# Patient Record
Sex: Female | Born: 1995 | Race: Black or African American | Hispanic: No | Marital: Single | Smoking: Never smoker
Health system: Southern US, Community
[De-identification: ages and names within clinical notes are randomized; demographics above are authoritative.]

---

## 2017-05-23 ENCOUNTER — Inpatient Hospital Stay (HOSPITAL_COMMUNITY)
Admission: EM | Admit: 2017-05-23 | Discharge: 2017-06-02 | DRG: 460 | Disposition: A | Discharge status: Rehabilitation Facility | Payer: No Typology Code available for payment source | Attending: General Surgery | Admitting: General Surgery

## 2017-05-23 ENCOUNTER — Emergency Department (HOSPITAL_COMMUNITY): Payer: No Typology Code available for payment source

## 2017-05-23 ENCOUNTER — Encounter (HOSPITAL_COMMUNITY): Payer: Self-pay | Admitting: Emergency Medicine

## 2017-05-23 DIAGNOSIS — M5416 Radiculopathy, lumbar region: Secondary | ICD-10-CM | POA: Diagnosis present

## 2017-05-23 DIAGNOSIS — R112 Nausea with vomiting, unspecified: Secondary | ICD-10-CM

## 2017-05-23 DIAGNOSIS — S2242XA Multiple fractures of ribs, left side, initial encounter for closed fracture: Secondary | ICD-10-CM | POA: Diagnosis present

## 2017-05-23 DIAGNOSIS — S32032A Unstable burst fracture of third lumbar vertebra, initial encounter for closed fracture: Secondary | ICD-10-CM

## 2017-05-23 DIAGNOSIS — K5900 Constipation, unspecified: Secondary | ICD-10-CM | POA: Diagnosis not present

## 2017-05-23 DIAGNOSIS — Y92410 Unspecified street and highway as the place of occurrence of the external cause: Secondary | ICD-10-CM

## 2017-05-23 DIAGNOSIS — D62 Acute posthemorrhagic anemia: Secondary | ICD-10-CM | POA: Diagnosis not present

## 2017-05-23 DIAGNOSIS — M48061 Spinal stenosis, lumbar region without neurogenic claudication: Secondary | ICD-10-CM | POA: Diagnosis present

## 2017-05-23 DIAGNOSIS — Z419 Encounter for procedure for purposes other than remedying health state, unspecified: Secondary | ICD-10-CM

## 2017-05-23 LAB — CBC WITH DIFFERENTIAL/PLATELET
BASOS ABS: 0 10*3/uL (ref 0.0–0.1)
Basophils Relative: 0 %
EOS PCT: 0 %
Eosinophils Absolute: 0 10*3/uL (ref 0.0–0.7)
HEMATOCRIT: 38.8 % (ref 36.0–46.0)
Hemoglobin: 13.7 g/dL (ref 12.0–15.0)
LYMPHS PCT: 4 %
Lymphs Abs: 0.8 10*3/uL (ref 0.7–4.0)
MCH: 30 pg (ref 26.0–34.0)
MCHC: 35.3 g/dL (ref 30.0–36.0)
MCV: 84.9 fL (ref 78.0–100.0)
Monocytes Absolute: 0.7 10*3/uL (ref 0.1–1.0)
Monocytes Relative: 4 %
NEUTROS ABS: 18.5 10*3/uL — AB (ref 1.7–7.7)
Neutrophils Relative %: 92 %
PLATELETS: 207 10*3/uL (ref 150–400)
RBC: 4.57 MIL/uL (ref 3.87–5.11)
RDW: 12.3 % (ref 11.5–15.5)
WBC: 20 10*3/uL — AB (ref 4.0–10.5)

## 2017-05-23 LAB — BASIC METABOLIC PANEL
ANION GAP: 10 (ref 5–15)
BUN: 11 mg/dL (ref 6–20)
CO2: 22 mmol/L (ref 22–32)
Calcium: 9.3 mg/dL (ref 8.9–10.3)
Chloride: 102 mmol/L (ref 101–111)
Creatinine, Ser: 0.93 mg/dL (ref 0.44–1.00)
GFR calc Af Amer: >60 mL/min (ref >60)
GLUCOSE: 132 mg/dL — AB (ref 65–99)
POTASSIUM: 3.9 mmol/L (ref 3.5–5.1)
Sodium: 134 mmol/L — ABNORMAL LOW (ref 135–145)

## 2017-05-23 LAB — I-STAT BETA HCG BLOOD, ED (MC, WL, AP ONLY): I-stat hCG, quantitative: <5 m[IU]/mL (ref <5)

## 2017-05-23 LAB — MRSA PCR SCREENING: MRSA by PCR: NEGATIVE

## 2017-05-23 MED ORDER — PANTOPRAZOLE SODIUM 40 MG PO TBEC
40.0000 mg | DELAYED_RELEASE_TABLET | Freq: Every day | ORAL | Status: DC
  Administered 2017-05-24 – 2017-06-02 (×9): 40 mg via ORAL
  Filled 2017-05-23 (×10): qty 1

## 2017-05-23 MED ORDER — PANTOPRAZOLE SODIUM 40 MG IV SOLR: 40.0000 mg | Freq: Every day | INTRAVENOUS | Status: DC

## 2017-05-23 MED ORDER — ONDANSETRON HCL 4 MG/2ML IJ SOLN
4.0000 mg | Freq: Four times a day (QID) | INTRAMUSCULAR | Status: DC | PRN
  Administered 2017-05-23 – 2017-05-24 (×3): 4 mg via INTRAVENOUS
  Filled 2017-05-23 (×3): qty 2

## 2017-05-23 MED ORDER — ONDANSETRON 4 MG PO TBDP: 4.0000 mg | ORAL_TABLET | Freq: Four times a day (QID) | ORAL | Status: DC | PRN

## 2017-05-23 MED ORDER — ONDANSETRON HCL 4 MG/2ML IJ SOLN
4.0000 mg | Freq: Once | INTRAMUSCULAR | Status: AC
  Administered 2017-05-23: 4 mg via INTRAVENOUS
  Filled 2017-05-23: qty 2

## 2017-05-23 MED ORDER — IOPAMIDOL (ISOVUE-300) INJECTION 61%
100.0000 mL | Freq: Once | INTRAVENOUS | Status: AC | PRN
  Administered 2017-05-23: 100 mL via INTRAVENOUS

## 2017-05-23 MED ORDER — HYDROMORPHONE HCL 2 MG/ML IJ SOLN
1.0000 mg | Freq: Once | INTRAMUSCULAR | Status: DC
  Filled 2017-05-23: qty 1

## 2017-05-23 MED ORDER — IOPAMIDOL (ISOVUE-300) INJECTION 61%
INTRAVENOUS | Status: AC
  Filled 2017-05-23: qty 100

## 2017-05-23 MED ORDER — HYDROMORPHONE HCL 2 MG/ML IJ SOLN
1.0000 mg | Freq: Once | INTRAMUSCULAR | Status: AC
  Administered 2017-05-23: 1 mg via INTRAVENOUS
  Filled 2017-05-23: qty 1

## 2017-05-23 MED ORDER — MORPHINE SULFATE (PF) 4 MG/ML IV SOLN
1.0000 mg | INTRAVENOUS | Status: DC | PRN
  Administered 2017-05-23 (×3): 1 mg via INTRAVENOUS
  Filled 2017-05-23 (×3): qty 1

## 2017-05-23 MED ORDER — HYDROMORPHONE HCL 1 MG/ML IJ SOLN
1.0000 mg | INTRAMUSCULAR | Status: DC | PRN
  Administered 2017-05-23 – 2017-05-24 (×3): 1 mg via INTRAVENOUS
  Filled 2017-05-23 (×3): qty 1

## 2017-05-23 MED ORDER — PROMETHAZINE HCL 25 MG/ML IJ SOLN
12.5000 mg | Freq: Four times a day (QID) | INTRAMUSCULAR | Status: DC | PRN
  Administered 2017-05-23 – 2017-06-01 (×13): 12.5 mg via INTRAVENOUS
  Filled 2017-05-23 (×14): qty 1

## 2017-05-23 MED ORDER — OXYCODONE-ACETAMINOPHEN 5-325 MG PO TABS
1.0000 | ORAL_TABLET | Freq: Once | ORAL | Status: AC
  Administered 2017-05-23: 1 via ORAL
  Filled 2017-05-23: qty 1

## 2017-05-23 MED ORDER — MORPHINE SULFATE (PF) 4 MG/ML IV SOLN
4.0000 mg | Freq: Once | INTRAVENOUS | Status: AC
  Administered 2017-05-23: 4 mg via INTRAVENOUS
  Filled 2017-05-23: qty 1

## 2017-05-23 MED ORDER — HYDROMORPHONE HCL 1 MG/ML IJ SOLN: 1.0000 mg | INTRAMUSCULAR | Status: DC | PRN

## 2017-05-23 MED ORDER — HYDROMORPHONE HCL 2 MG/ML IJ SOLN: 1.0000 mg | INTRAMUSCULAR | Status: DC | PRN

## 2017-05-23 MED ORDER — DEXTROSE-NACL 5-0.9 % IV SOLN
INTRAVENOUS | Status: DC
  Administered 2017-05-23 – 2017-05-25 (×5): via INTRAVENOUS

## 2017-05-23 NOTE — Progress Notes (Signed)
Paged Dr  Luisa Hartornett regarding pt nausea. See prev note
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
## 2017-05-23 NOTE — Progress Notes (Signed)
Patient is continuing to vomit yellow emesis after Zofran 4 mg IV given, see eMAR. Dr Derryl Harborostella is paged and awaiting call back.
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
## 2017-05-23 NOTE — ED Notes (Signed)
Patient transported to CT 

## 2017-05-23 NOTE — ED Notes (Signed)
Ortho tech notified of need for TLSO brace  

## 2017-05-23 NOTE — ED Notes (Signed)
Dr. Luisa Hartornett paged to RN Nehemiah SettleBrooke per her request

## 2017-05-24 ENCOUNTER — Other Ambulatory Visit: Payer: Self-pay

## 2017-05-24 ENCOUNTER — Encounter (HOSPITAL_COMMUNITY): Payer: Self-pay

## 2017-05-24 LAB — COMPREHENSIVE METABOLIC PANEL
ALBUMIN: 3.3 g/dL — AB (ref 3.5–5.0)
ALT: 75 U/L — AB (ref 14–54)
AST: 82 U/L — AB (ref 15–41)
Alkaline Phosphatase: 41 U/L (ref 38–126)
Anion gap: 8 (ref 5–15)
BILIRUBIN TOTAL: 0.7 mg/dL (ref 0.3–1.2)
BUN: 5 mg/dL — AB (ref 6–20)
CHLORIDE: 106 mmol/L (ref 101–111)
CO2: 23 mmol/L (ref 22–32)
CREATININE: 0.74 mg/dL (ref 0.44–1.00)
Calcium: 8.7 mg/dL — ABNORMAL LOW (ref 8.9–10.3)
GFR calc Af Amer: >60 mL/min (ref >60)
GLUCOSE: 143 mg/dL — AB (ref 65–99)
Potassium: 4.1 mmol/L (ref 3.5–5.1)
Sodium: 137 mmol/L (ref 135–145)
TOTAL PROTEIN: 6.3 g/dL — AB (ref 6.5–8.1)

## 2017-05-24 LAB — CBC
HEMATOCRIT: 38 % (ref 36.0–46.0)
Hemoglobin: 12.9 g/dL (ref 12.0–15.0)
MCH: 29.5 pg (ref 26.0–34.0)
MCHC: 33.9 g/dL (ref 30.0–36.0)
MCV: 86.8 fL (ref 78.0–100.0)
Platelets: 179 10*3/uL (ref 150–400)
RBC: 4.38 MIL/uL (ref 3.87–5.11)
RDW: 12.8 % (ref 11.5–15.5)
WBC: 13.6 10*3/uL — ABNORMAL HIGH (ref 4.0–10.5)

## 2017-05-24 LAB — HIV ANTIBODY (ROUTINE TESTING W REFLEX): HIV Screen 4th Generation wRfx: NONREACTIVE

## 2017-05-24 MED ORDER — LEVONORGESTREL-ETHINYL ESTRAD 0.1-20 MG-MCG PO TABS
1.0000 | ORAL_TABLET | Freq: Every day | ORAL | Status: DC
  Administered 2017-05-24 – 2017-06-01 (×5): 1 via ORAL

## 2017-05-24 MED ORDER — ENOXAPARIN SODIUM 40 MG/0.4ML ~~LOC~~ SOLN
40.0000 mg | SUBCUTANEOUS | Status: DC
  Administered 2017-05-24 – 2017-06-01 (×7): 40 mg via SUBCUTANEOUS
  Filled 2017-05-24 (×8): qty 0.4

## 2017-05-24 MED ORDER — DOCUSATE SODIUM 100 MG PO CAPS
100.0000 mg | ORAL_CAPSULE | Freq: Two times a day (BID) | ORAL | Status: DC
  Administered 2017-05-24 – 2017-05-25 (×4): 100 mg via ORAL
  Filled 2017-05-24 (×5): qty 1

## 2017-05-24 MED ORDER — ACETAMINOPHEN 500 MG PO TABS
1000.0000 mg | ORAL_TABLET | Freq: Four times a day (QID) | ORAL | Status: DC
  Administered 2017-05-24 – 2017-05-26 (×7): 1000 mg via ORAL
  Filled 2017-05-24 (×9): qty 2

## 2017-05-24 MED ORDER — OXYCODONE HCL 5 MG PO TABS
5.0000 mg | ORAL_TABLET | ORAL | Status: DC | PRN
  Administered 2017-05-24 – 2017-05-26 (×5): 10 mg via ORAL
  Filled 2017-05-24 (×5): qty 2

## 2017-05-24 MED ORDER — NON FORMULARY: 1.0000 | Freq: Every day | Status: DC

## 2017-05-24 MED ORDER — TRAMADOL HCL 50 MG PO TABS: 50.0000 mg | ORAL_TABLET | Freq: Four times a day (QID) | ORAL | Status: DC | PRN

## 2017-05-24 MED ORDER — MORPHINE SULFATE (PF) 4 MG/ML IV SOLN
1.0000 mg | INTRAVENOUS | Status: DC | PRN
  Administered 2017-05-24 – 2017-06-01 (×11): 2 mg via INTRAVENOUS
  Filled 2017-05-24 (×11): qty 1

## 2017-05-24 MED ORDER — METHOCARBAMOL 500 MG PO TABS
750.0000 mg | ORAL_TABLET | Freq: Three times a day (TID) | ORAL | Status: DC
  Administered 2017-05-24 – 2017-06-02 (×21): 750 mg via ORAL
  Filled 2017-05-24 (×26): qty 2

## 2017-05-24 NOTE — Progress Notes (Signed)
Orthopedic Tech Progress Note Patient Details:  Melissa Kramer 05-16-1995 [id number]  Patient ID: Melissa Kramer, female   DOB: 05-16-1995, 22 y.o.   MRN: [id number]   Nikki DomCrawford, Alithea Lapage 05/24/2017, 10:18 AM Called in bio-tech brace order; spoke with answering service

## 2017-05-24 NOTE — Progress Notes (Signed)
Clam shell brace applied while in bed.

## 2017-05-24 NOTE — Progress Notes (Signed)
1900: Handoff report received from RN. Pt sleeping with family at bedside. Discussed plan of care for the shift with pt's grandmother who is amenable to plan.  2000: Pt woken by nausea. Antiemetic and pain medication administered.   0000: Pt again awoken by pain an nausea.  0400: Pt continues resting comfortably.  0700: Handoff report given to RN. No acute events overnight.
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
## 2017-05-25 ENCOUNTER — Other Ambulatory Visit: Payer: Self-pay | Admitting: Neurosurgery

## 2017-05-25 ENCOUNTER — Inpatient Hospital Stay (HOSPITAL_COMMUNITY): Payer: No Typology Code available for payment source

## 2017-05-25 LAB — CBC
HCT: 36.1 % (ref 36.0–46.0)
Hemoglobin: 12.2 g/dL (ref 12.0–15.0)
MCH: 29.6 pg (ref 26.0–34.0)
MCHC: 33.8 g/dL (ref 30.0–36.0)
MCV: 87.6 fL (ref 78.0–100.0)
PLATELETS: 164 10*3/uL (ref 150–400)
RBC: 4.12 MIL/uL (ref 3.87–5.11)
RDW: 12.9 % (ref 11.5–15.5)
WBC: 12.1 10*3/uL — ABNORMAL HIGH (ref 4.0–10.5)

## 2017-05-25 NOTE — Progress Notes (Signed)
Neurosurgery Progress Note  Nausea persists Back pain manageable. Denies weakness in legs Voiding normal  EXAM:  BP 126/84   Pulse 91   Temp 99.4 F (37.4 C) (Oral)   Resp 14   Ht 5\' 9"  (1.753 m)   Wt 58.1 kg (128 lb)   LMP 05/16/2017 (Exact Date)   SpO2 96%   BMI 18.90 kg/m   Awake, alert, oriented  Speech fluent, appropriate  In clam shell brace MAEW with good strength. No LE deficits  PLAN Stable this am. Scheduled for surgery tomorrow. NPO at midnight Continue current care

## 2017-05-25 NOTE — Progress Notes (Signed)
   05/25/17 1200  Clinical Encounter Type  Visited With Patient and family together  Visit Type Social support;Spiritual support;Follow-up  Referral From Nurse  Consult/Referral To Chaplain  Spiritual Encounters  Spiritual Needs Prayer;Emotional  Stress Factors  Patient Stress Factors Health changes  Family Stress Factors Major life changes  Chaplain responded to nurse consult for PT prayer.  Present in the room were grandmother of PT and PT.  The PT had concerns about surgery tomorrow and anxiousness.  Chaplain prayed with PT and grandmother addressing both of their concerns.  Chaplain will revisit family tomorrow to pray before surgery

## 2017-05-25 NOTE — Progress Notes (Signed)
1900: Handoff report received from RN. Pt resting in bed. Discussed plan of care for the shift; pt amenable to plan. Pt denies nausea today.  0000: Pt resting comfortably.  0400: Pt continues resting comfortably.  0700: Handoff report given to RN. No acute events overnight.
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
## 2017-05-26 ENCOUNTER — Inpatient Hospital Stay (HOSPITAL_COMMUNITY): Payer: No Typology Code available for payment source

## 2017-05-26 ENCOUNTER — Encounter (HOSPITAL_COMMUNITY): Payer: Self-pay | Admitting: Certified Registered"

## 2017-05-26 ENCOUNTER — Inpatient Hospital Stay (HOSPITAL_COMMUNITY): Payer: No Typology Code available for payment source | Admitting: Certified Registered"

## 2017-05-26 ENCOUNTER — Inpatient Hospital Stay (HOSPITAL_COMMUNITY): Admission: EM | Disposition: A | Discharge status: Rehabilitation Facility | Payer: Self-pay | Source: Home / Self Care

## 2017-05-26 HISTORY — PX: APPLICATION OF ROBOTIC ASSISTANCE FOR SPINAL PROCEDURE: SHX6753

## 2017-05-26 HISTORY — PX: ANTERIOR LAT LUMBAR FUSION: SHX1168

## 2017-05-26 HISTORY — PX: POSTERIOR LUMBAR FUSION 4 LEVEL: SHX6037

## 2017-05-26 LAB — BASIC METABOLIC PANEL
ANION GAP: 8 (ref 5–15)
CALCIUM: 8.4 mg/dL — AB (ref 8.9–10.3)
CO2: 23 mmol/L (ref 22–32)
Chloride: 109 mmol/L (ref 101–111)
Creatinine, Ser: 0.72 mg/dL (ref 0.44–1.00)
GFR calc Af Amer: >60 mL/min (ref >60)
GLUCOSE: 103 mg/dL — AB (ref 65–99)
Potassium: 3.7 mmol/L (ref 3.5–5.1)
Sodium: 140 mmol/L (ref 135–145)

## 2017-05-26 LAB — CBC
HCT: 34.3 % — ABNORMAL LOW (ref 36.0–46.0)
Hemoglobin: 11.6 g/dL — ABNORMAL LOW (ref 12.0–15.0)
MCH: 29.7 pg (ref 26.0–34.0)
MCHC: 33.8 g/dL (ref 30.0–36.0)
MCV: 87.9 fL (ref 78.0–100.0)
PLATELETS: 157 10*3/uL (ref 150–400)
RBC: 3.9 MIL/uL (ref 3.87–5.11)
RDW: 13.1 % (ref 11.5–15.5)
WBC: 8.8 10*3/uL (ref 4.0–10.5)

## 2017-05-26 LAB — TYPE AND SCREEN
ABO/RH(D): O POS
Antibody Screen: NEGATIVE

## 2017-05-26 LAB — ABO/RH: ABO/RH(D): O POS

## 2017-05-26 SURGERY — ANTERIOR LATERAL LUMBAR FUSION 1 LEVEL
Anesthesia: General | Site: Spine Lumbar

## 2017-05-26 MED ORDER — PROPOFOL 10 MG/ML IV BOLUS
INTRAVENOUS | Status: AC
  Filled 2017-05-26: qty 20

## 2017-05-26 MED ORDER — SUCCINYLCHOLINE CHLORIDE 20 MG/ML IJ SOLN: INTRAMUSCULAR | Status: DC | PRN

## 2017-05-26 MED ORDER — GELATIN ABSORBABLE MT POWD
OROMUCOSAL | Status: DC | PRN
  Administered 2017-05-26: 14:00:00 via TOPICAL

## 2017-05-26 MED ORDER — FENTANYL CITRATE (PF) 250 MCG/5ML IJ SOLN
INTRAMUSCULAR | Status: AC
Start: 2017-05-26 — End: 2017-05-26
  Filled 2017-05-26: qty 5

## 2017-05-26 MED ORDER — ACETAMINOPHEN 325 MG PO TABS
650.0000 mg | ORAL_TABLET | ORAL | Status: DC | PRN
Start: 2017-05-26 — End: 2017-06-02
  Administered 2017-05-28: 650 mg via ORAL
  Filled 2017-05-26: qty 2

## 2017-05-26 MED ORDER — MENTHOL 3 MG MT LOZG: 1.0000 | LOZENGE | OROMUCOSAL | Status: DC | PRN

## 2017-05-26 MED ORDER — SUCCINYLCHOLINE CHLORIDE 20 MG/ML IJ SOLN
INTRAMUSCULAR | Status: DC | PRN
  Administered 2017-05-26: 100 mg via INTRAVENOUS

## 2017-05-26 MED ORDER — ONDANSETRON HCL 4 MG/2ML IJ SOLN
INTRAMUSCULAR | Status: AC
  Filled 2017-05-26: qty 2

## 2017-05-26 MED ORDER — SODIUM CHLORIDE 0.9 % IV SOLN: 250.0000 mL | INTRAVENOUS | Status: DC

## 2017-05-26 MED ORDER — ACETAMINOPHEN 500 MG PO TABS
1000.0000 mg | ORAL_TABLET | Freq: Four times a day (QID) | ORAL | Status: AC
  Administered 2017-05-26 – 2017-05-27 (×4): 1000 mg via ORAL
  Filled 2017-05-26 (×4): qty 2

## 2017-05-26 MED ORDER — FENTANYL CITRATE (PF) 250 MCG/5ML IJ SOLN
INTRAMUSCULAR | Status: AC
  Filled 2017-05-26: qty 5

## 2017-05-26 MED ORDER — BISACODYL 10 MG RE SUPP
10.0000 mg | Freq: Every day | RECTAL | Status: DC | PRN
  Administered 2017-05-28: 10 mg via RECTAL
  Filled 2017-05-26 (×2): qty 1

## 2017-05-26 MED ORDER — SENNOSIDES-DOCUSATE SODIUM 8.6-50 MG PO TABS: 1.0000 | ORAL_TABLET | Freq: Every evening | ORAL | Status: DC | PRN

## 2017-05-26 MED ORDER — MIDAZOLAM HCL 5 MG/5ML IJ SOLN
INTRAMUSCULAR | Status: DC | PRN
  Administered 2017-05-26: 2 mg via INTRAVENOUS

## 2017-05-26 MED ORDER — HYDROMORPHONE HCL 2 MG/ML IJ SOLN
0.2500 mg | INTRAMUSCULAR | Status: DC | PRN
  Administered 2017-05-26: 0.5 mg via INTRAVENOUS

## 2017-05-26 MED ORDER — OXYCODONE HCL 5 MG PO TABS
5.0000 mg | ORAL_TABLET | ORAL | Status: DC | PRN
  Administered 2017-05-26: 10 mg via ORAL

## 2017-05-26 MED ORDER — ONDANSETRON HCL 4 MG PO TABS
4.0000 mg | ORAL_TABLET | Freq: Four times a day (QID) | ORAL | Status: DC | PRN
  Administered 2017-05-30 – 2017-06-02 (×2): 4 mg via ORAL
  Filled 2017-05-26 (×2): qty 1

## 2017-05-26 MED ORDER — LIDOCAINE-EPINEPHRINE 1 %-1:100000 IJ SOLN
INTRAMUSCULAR | Status: AC
  Filled 2017-05-26: qty 1

## 2017-05-26 MED ORDER — CHLORHEXIDINE GLUCONATE CLOTH 2 % EX PADS: 6.0000 | MEDICATED_PAD | Freq: Once | CUTANEOUS | Status: DC

## 2017-05-26 MED ORDER — BUPIVACAINE HCL (PF) 0.5 % IJ SOLN
INTRAMUSCULAR | Status: DC | PRN
  Administered 2017-05-26: 8 mL
  Administered 2017-05-26: 9 mL

## 2017-05-26 MED ORDER — PHENYLEPHRINE 40 MCG/ML (10ML) SYRINGE FOR IV PUSH (FOR BLOOD PRESSURE SUPPORT)
PREFILLED_SYRINGE | INTRAVENOUS | Status: DC | PRN
  Administered 2017-05-26: 40 ug via INTRAVENOUS

## 2017-05-26 MED ORDER — PHENOL 1.4 % MT LIQD: 1.0000 | OROMUCOSAL | Status: DC | PRN

## 2017-05-26 MED ORDER — ROCURONIUM BROMIDE 100 MG/10ML IV SOLN
INTRAVENOUS | Status: DC | PRN
  Administered 2017-05-26: 20 mg via INTRAVENOUS
  Administered 2017-05-26: 50 mg via INTRAVENOUS

## 2017-05-26 MED ORDER — THROMBIN (RECOMBINANT) 20000 UNITS EX SOLR
CUTANEOUS | Status: DC | PRN
  Administered 2017-05-26: 14:00:00 via TOPICAL

## 2017-05-26 MED ORDER — 0.9 % SODIUM CHLORIDE (POUR BTL) OPTIME
TOPICAL | Status: DC | PRN
  Administered 2017-05-26: 1000 mL

## 2017-05-26 MED ORDER — ONDANSETRON HCL 4 MG/2ML IJ SOLN
INTRAMUSCULAR | Status: DC | PRN
  Administered 2017-05-26: 4 mg via INTRAVENOUS

## 2017-05-26 MED ORDER — ONDANSETRON HCL 4 MG/2ML IJ SOLN
4.0000 mg | Freq: Four times a day (QID) | INTRAMUSCULAR | Status: DC | PRN
  Administered 2017-05-28 – 2017-06-01 (×8): 4 mg via INTRAVENOUS
  Filled 2017-05-26 (×8): qty 2

## 2017-05-26 MED ORDER — OXYCODONE HCL 5 MG PO TABS
5.0000 mg | ORAL_TABLET | ORAL | Status: DC | PRN
  Administered 2017-05-26: 10 mg via ORAL
  Administered 2017-05-27: 5 mg via ORAL
  Administered 2017-05-27 (×2): 10 mg via ORAL
  Filled 2017-05-26 (×4): qty 2

## 2017-05-26 MED ORDER — METHOCARBAMOL 500 MG PO TABS
ORAL_TABLET | ORAL | Status: AC
  Filled 2017-05-26: qty 1

## 2017-05-26 MED ORDER — PROPOFOL 500 MG/50ML IV EMUL
INTRAVENOUS | Status: DC | PRN
  Administered 2017-05-26: 75 ug/kg/min via INTRAVENOUS

## 2017-05-26 MED ORDER — SODIUM CHLORIDE 0.9% FLUSH: 3.0000 mL | INTRAVENOUS | Status: DC | PRN

## 2017-05-26 MED ORDER — THROMBIN 5000 UNITS EX SOLR
CUTANEOUS | Status: AC
  Filled 2017-05-26: qty 5000

## 2017-05-26 MED ORDER — THROMBIN 20000 UNITS EX SOLR
CUTANEOUS | Status: AC
  Filled 2017-05-26: qty 20000

## 2017-05-26 MED ORDER — DEXAMETHASONE SODIUM PHOSPHATE 10 MG/ML IJ SOLN
INTRAMUSCULAR | Status: DC | PRN
  Administered 2017-05-26: 10 mg via INTRAVENOUS

## 2017-05-26 MED ORDER — HYDROCODONE-ACETAMINOPHEN 5-325 MG PO TABS: 1.0000 | ORAL_TABLET | ORAL | Status: DC | PRN

## 2017-05-26 MED ORDER — PHENYLEPHRINE 40 MCG/ML (10ML) SYRINGE FOR IV PUSH (FOR BLOOD PRESSURE SUPPORT)
PREFILLED_SYRINGE | INTRAVENOUS | Status: AC
  Filled 2017-05-26: qty 10

## 2017-05-26 MED ORDER — METHOCARBAMOL 1000 MG/10ML IJ SOLN
500.0000 mg | Freq: Four times a day (QID) | INTRAVENOUS | Status: DC | PRN
  Filled 2017-05-26: qty 5

## 2017-05-26 MED ORDER — CHLORHEXIDINE GLUCONATE CLOTH 2 % EX PADS
6.0000 | MEDICATED_PAD | Freq: Once | CUTANEOUS | Status: AC
  Administered 2017-05-26: 6 via TOPICAL

## 2017-05-26 MED ORDER — CEFAZOLIN SODIUM-DEXTROSE 2-4 GM/100ML-% IV SOLN
2.0000 g | INTRAVENOUS | Status: AC
  Administered 2017-05-26 (×2): 2 g via INTRAVENOUS
  Filled 2017-05-26 (×2): qty 100

## 2017-05-26 MED ORDER — GABAPENTIN 300 MG PO CAPS
300.0000 mg | ORAL_CAPSULE | Freq: Three times a day (TID) | ORAL | Status: DC
  Administered 2017-05-26 – 2017-05-27 (×4): 300 mg via ORAL
  Filled 2017-05-26 (×4): qty 1

## 2017-05-26 MED ORDER — LACTATED RINGERS IV SOLN
INTRAVENOUS | Status: DC
  Administered 2017-05-26 (×2): via INTRAVENOUS

## 2017-05-26 MED ORDER — MIDAZOLAM HCL 2 MG/2ML IJ SOLN
INTRAMUSCULAR | Status: AC
  Filled 2017-05-26: qty 2

## 2017-05-26 MED ORDER — FLEET ENEMA 7-19 GM/118ML RE ENEM
1.0000 | ENEMA | Freq: Once | RECTAL | Status: DC | PRN
  Filled 2017-05-26: qty 1

## 2017-05-26 MED ORDER — ALBUMIN HUMAN 5 % IV SOLN
INTRAVENOUS | Status: DC | PRN
  Administered 2017-05-26 (×3): via INTRAVENOUS

## 2017-05-26 MED ORDER — SUGAMMADEX SODIUM 200 MG/2ML IV SOLN
INTRAVENOUS | Status: DC | PRN
  Administered 2017-05-26: 200 mg via INTRAVENOUS

## 2017-05-26 MED ORDER — CEFAZOLIN SODIUM 1 G IJ SOLR
INTRAMUSCULAR | Status: AC
  Filled 2017-05-26: qty 40

## 2017-05-26 MED ORDER — HYDROMORPHONE HCL 2 MG/ML IJ SOLN
INTRAMUSCULAR | Status: AC
  Filled 2017-05-26: qty 1

## 2017-05-26 MED ORDER — ROCURONIUM BROMIDE 10 MG/ML (PF) SYRINGE
PREFILLED_SYRINGE | INTRAVENOUS | Status: AC
  Filled 2017-05-26: qty 5

## 2017-05-26 MED ORDER — METHOCARBAMOL 500 MG PO TABS
500.0000 mg | ORAL_TABLET | Freq: Four times a day (QID) | ORAL | Status: DC | PRN
  Administered 2017-05-26 – 2017-05-31 (×3): 500 mg via ORAL
  Filled 2017-05-26 (×2): qty 1

## 2017-05-26 MED ORDER — LIDOCAINE HCL (CARDIAC) 20 MG/ML IV SOLN
INTRAVENOUS | Status: DC | PRN
  Administered 2017-05-26: 100 mg via INTRAVENOUS

## 2017-05-26 MED ORDER — HYDROMORPHONE HCL 1 MG/ML IJ SOLN
0.5000 mg | INTRAMUSCULAR | Status: DC | PRN
  Administered 2017-05-26 – 2017-06-01 (×18): 1 mg via INTRAVENOUS
  Filled 2017-05-26 (×18): qty 1

## 2017-05-26 MED ORDER — DOCUSATE SODIUM 100 MG PO CAPS
100.0000 mg | ORAL_CAPSULE | Freq: Two times a day (BID) | ORAL | Status: DC
  Administered 2017-05-26 – 2017-06-02 (×14): 100 mg via ORAL
  Filled 2017-05-26 (×15): qty 1

## 2017-05-26 MED ORDER — SODIUM CHLORIDE 0.9 % IV SOLN
INTRAVENOUS | Status: DC | PRN
  Administered 2017-05-26: 15:00:00

## 2017-05-26 MED ORDER — SUCCINYLCHOLINE CHLORIDE 200 MG/10ML IV SOSY
PREFILLED_SYRINGE | INTRAVENOUS | Status: AC
  Filled 2017-05-26: qty 20

## 2017-05-26 MED ORDER — ACETAMINOPHEN 650 MG RE SUPP: 650.0000 mg | RECTAL | Status: DC | PRN

## 2017-05-26 MED ORDER — PROPOFOL 10 MG/ML IV BOLUS
INTRAVENOUS | Status: DC | PRN
  Administered 2017-05-26 (×2): 50 mg via INTRAVENOUS
  Administered 2017-05-26: 100 mg via INTRAVENOUS

## 2017-05-26 MED ORDER — BUPIVACAINE HCL (PF) 0.5 % IJ SOLN
INTRAMUSCULAR | Status: AC
  Filled 2017-05-26: qty 30

## 2017-05-26 MED ORDER — FENTANYL CITRATE (PF) 100 MCG/2ML IJ SOLN
INTRAMUSCULAR | Status: DC | PRN
  Administered 2017-05-26: 50 ug via INTRAVENOUS
  Administered 2017-05-26: 100 ug via INTRAVENOUS
  Administered 2017-05-26 (×3): 50 ug via INTRAVENOUS
  Administered 2017-05-26: 100 ug via INTRAVENOUS
  Administered 2017-05-26 (×2): 50 ug via INTRAVENOUS
  Administered 2017-05-26: 100 ug via INTRAVENOUS
  Administered 2017-05-26 (×3): 50 ug via INTRAVENOUS

## 2017-05-26 MED ORDER — MEPERIDINE HCL 50 MG/ML IJ SOLN: 12.5000 mg | Freq: Once | INTRAMUSCULAR | Status: DC

## 2017-05-26 MED ORDER — LIDOCAINE 2% (20 MG/ML) 5 ML SYRINGE
INTRAMUSCULAR | Status: AC
  Filled 2017-05-26: qty 5

## 2017-05-26 MED ORDER — SODIUM CHLORIDE 0.9% FLUSH
3.0000 mL | Freq: Two times a day (BID) | INTRAVENOUS | Status: DC
  Administered 2017-05-26 – 2017-06-01 (×12): 3 mL via INTRAVENOUS

## 2017-05-26 MED ORDER — SENNA 8.6 MG PO TABS
1.0000 | ORAL_TABLET | Freq: Two times a day (BID) | ORAL | Status: DC
  Administered 2017-05-26 – 2017-06-02 (×13): 8.6 mg via ORAL
  Filled 2017-05-26 (×14): qty 1

## 2017-05-26 MED ORDER — CEFAZOLIN SODIUM-DEXTROSE 2-4 GM/100ML-% IV SOLN
2.0000 g | Freq: Three times a day (TID) | INTRAVENOUS | Status: AC
  Administered 2017-05-27 (×2): 2 g via INTRAVENOUS
  Filled 2017-05-26 (×3): qty 100

## 2017-05-26 MED ORDER — SODIUM CHLORIDE 0.9 % IV SOLN
INTRAVENOUS | Status: DC
  Administered 2017-05-26: 21:00:00 via INTRAVENOUS

## 2017-05-26 MED ORDER — LIDOCAINE-EPINEPHRINE 1 %-1:100000 IJ SOLN
INTRAMUSCULAR | Status: DC | PRN
  Administered 2017-05-26: 8 mL
  Administered 2017-05-26: 9 mL

## 2017-05-26 MED ORDER — LACTATED RINGERS IV SOLN
INTRAVENOUS | Status: DC | PRN
  Administered 2017-05-26 (×3): via INTRAVENOUS

## 2017-05-26 MED ORDER — SUGAMMADEX SODIUM 200 MG/2ML IV SOLN
INTRAVENOUS | Status: AC
  Filled 2017-05-26: qty 2

## 2017-05-26 MED ORDER — OXYCODONE HCL 5 MG PO TABS
ORAL_TABLET | ORAL | Status: AC
  Filled 2017-05-26: qty 2

## 2017-05-26 SURGICAL SUPPLY — 99 items
BAG DECANTER FOR FLEXI CONT (MISCELLANEOUS) ×4 IMPLANT
BASKET BONE COLLECTION (BASKET) IMPLANT
BIT DRILL LONG 3.0X30 (BIT) IMPLANT
BIT DRILL LONG 3.0X30MM (BIT)
BIT DRILL LONG 3X80 (BIT) IMPLANT
BIT DRILL LONG 3X80MM (BIT)
BIT DRILL LONG 4X80 (BIT) IMPLANT
BIT DRILL LONG 4X80MM (BIT)
BIT DRILL SHORT 3.0X30 (BIT) IMPLANT
BIT DRILL SHORT 3.0X30MM (BIT)
BIT DRILL SHORT 3X80 (BIT) IMPLANT
BIT DRILL SHORT 3X80MM (BIT)
BLADE SURG 11 STRL SS (BLADE) ×4 IMPLANT
BUR MATCHSTICK NEURO 3.0 LAGG (BURR) IMPLANT
BUR PRECISION FLUTE 5.0 (BURR) ×4 IMPLANT
CANISTER SUCT 3000ML PPV (MISCELLANEOUS) ×4 IMPLANT
CAP END 15X18 CORE X 2 40 (Neuro Prosthesis/Implant) ×2 IMPLANT
CAP END 15X18 CORE X 2 40 0 (Neuro Prosthesis/Implant) ×2 IMPLANT
CAP END 2 TI LOCK SCREW X-CORE (Screw) ×8 IMPLANT
CARTRIDGE OIL MAESTRO DRILL (MISCELLANEOUS) ×2 IMPLANT
CLOSURE WOUND 1/2 X4 (GAUZE/BANDAGES/DRESSINGS)
CONT SPEC 4OZ CLIKSEAL STRL BL (MISCELLANEOUS) ×4 IMPLANT
CORE XCORE 2 AUTO 18X24-33MM (Neuro Prosthesis/Implant) ×4 IMPLANT
COVER BACK TABLE 60X90IN (DRAPES) ×4 IMPLANT
DECANTER SPIKE VIAL GLASS SM (MISCELLANEOUS) ×4 IMPLANT
DERMABOND ADVANCED (GAUZE/BANDAGES/DRESSINGS) ×2
DERMABOND ADVANCED .7 DNX12 (GAUZE/BANDAGES/DRESSINGS) ×2 IMPLANT
DIFFUSER DRILL AIR PNEUMATIC (MISCELLANEOUS) ×4 IMPLANT
DISSECTOR BLUNT TIP ENDO 5MM (MISCELLANEOUS) ×8 IMPLANT
DRAPE C-ARM 42X72 X-RAY (DRAPES) ×12 IMPLANT
DRAPE C-ARMOR (DRAPES) ×8 IMPLANT
DRAPE LAPAROTOMY 100X72X124 (DRAPES) ×8 IMPLANT
DRAPE SHEET LG 3/4 BI-LAMINATE (DRAPES) ×4 IMPLANT
DRAPE SURG 17X23 STRL (DRAPES) ×4 IMPLANT
DRSG OPSITE POSTOP 4X8 (GAUZE/BANDAGES/DRESSINGS) ×8 IMPLANT
DURAPREP 26ML APPLICATOR (WOUND CARE) ×8 IMPLANT
ELECT BLADE 4.0 EZ CLEAN MEGAD (MISCELLANEOUS) ×4
ELECT REM PT RETURN 9FT ADLT (ELECTROSURGICAL) ×8
ELECTRODE BLDE 4.0 EZ CLN MEGD (MISCELLANEOUS) ×2 IMPLANT
ELECTRODE REM PT RTRN 9FT ADLT (ELECTROSURGICAL) ×4 IMPLANT
ENDCAP CORE X 2 15X18X40 0 (Neuro Prosthesis/Implant) ×2 IMPLANT
ENDCAP CORE X 2 15X18X40 4 (Neuro Prosthesis/Implant) ×2 IMPLANT
GAUZE SPONGE 4X4 12PLY STRL (GAUZE/BANDAGES/DRESSINGS) IMPLANT
GAUZE SPONGE 4X4 16PLY XRAY LF (GAUZE/BANDAGES/DRESSINGS) ×4 IMPLANT
GLOVE BIO SURGEON STRL SZ7 (GLOVE) IMPLANT
GLOVE BIO SURGEON STRL SZ7.5 (GLOVE) IMPLANT
GLOVE BIOGEL PI IND STRL 7.0 (GLOVE) IMPLANT
GLOVE BIOGEL PI IND STRL 7.5 (GLOVE) ×6 IMPLANT
GLOVE BIOGEL PI INDICATOR 7.0 (GLOVE)
GLOVE BIOGEL PI INDICATOR 7.5 (GLOVE) ×6
GLOVE ECLIPSE 7.0 STRL STRAW (GLOVE) ×8 IMPLANT
GOWN STRL REUS W/ TWL LRG LVL3 (GOWN DISPOSABLE) ×4 IMPLANT
GOWN STRL REUS W/ TWL XL LVL3 (GOWN DISPOSABLE) ×4 IMPLANT
GOWN STRL REUS W/TWL 2XL LVL3 (GOWN DISPOSABLE) IMPLANT
GOWN STRL REUS W/TWL LRG LVL3 (GOWN DISPOSABLE) ×4
GOWN STRL REUS W/TWL XL LVL3 (GOWN DISPOSABLE) ×4
GRAFT DURAGEN MATRIX 2WX2L ×4 IMPLANT
GUIDEWIRE NITINOL BEVEL TIP (WIRE) ×32 IMPLANT
HEMOSTAT POWDER KIT SURGIFOAM (HEMOSTASIS) ×4 IMPLANT
KIT BASIN OR (CUSTOM PROCEDURE TRAY) ×8 IMPLANT
KIT DILATOR XLIF 5 (KITS) ×3 IMPLANT
KIT POSITION SURG JACKSON T1 (MISCELLANEOUS) ×4 IMPLANT
KIT SPINE MAZOR X ROBO DISP (MISCELLANEOUS) ×4 IMPLANT
KIT SURGICAL ACCESS MAXCESS 4 (KITS) ×4 IMPLANT
KIT TURNOVER KIT B (KITS) ×8 IMPLANT
KIT XLIF (KITS) ×1
MILL MEDIUM DISP (BLADE) IMPLANT
MODULE EMG NEEDLE SSEP NVM5 (NEEDLE) ×4 IMPLANT
MODULE NVM5 NEXT GEN EMG (NEEDLE) ×4 IMPLANT
NEEDLE HYPO 18GX1.5 BLUNT FILL (NEEDLE) IMPLANT
NEEDLE HYPO 22GX1.5 SAFETY (NEEDLE) ×4 IMPLANT
NEEDLE SPNL 18GX3.5 QUINCKE PK (NEEDLE) IMPLANT
NS IRRIG 1000ML POUR BTL (IV SOLUTION) ×4 IMPLANT
OIL CARTRIDGE MAESTRO DRILL (MISCELLANEOUS) ×4
PACK LAMINECTOMY NEURO (CUSTOM PROCEDURE TRAY) ×8 IMPLANT
PAD ARMBOARD 7.5X6 YLW CONV (MISCELLANEOUS) ×12 IMPLANT
PIN HEAD 2.5X60MM (PIN) IMPLANT
ROD PRECEPT TI 130MM LORD PB (Rod) ×4 IMPLANT
ROD RELINE MAS LORD 5.5X120MM (Rod) ×4 IMPLANT
SCREW LOCK RELINE 5.5 TULIP (Screw) ×32 IMPLANT
SCREW MAS RELINE POLY 6.5X40 (Screw) ×8 IMPLANT
SCREW RELINE MAS POLY 5.5X40 (Screw) ×24 IMPLANT
SCREW SCHANZ SA 4.0MM (MISCELLANEOUS) IMPLANT
SCREW SET ENDCAP (Screw) ×8 IMPLANT
SPONGE LAP 4X18 X RAY DECT (DISPOSABLE) IMPLANT
SPONGE SURGIFOAM ABS GEL 100 (HEMOSTASIS) ×4 IMPLANT
SPONGE TONSIL TAPE 1 RFD (DISPOSABLE) IMPLANT
STAPLER VISISTAT 35W (STAPLE) ×4 IMPLANT
STRIP CLOSURE SKIN 1/2X4 (GAUZE/BANDAGES/DRESSINGS) IMPLANT
SUT VIC AB 0 CT1 18XCR BRD8 (SUTURE) ×8 IMPLANT
SUT VIC AB 0 CT1 8-18 (SUTURE) ×8
SUT VIC AB 2-0 CT1 18 (SUTURE) ×20 IMPLANT
SUT VIC AB 3-0 SH 8-18 (SUTURE) ×4 IMPLANT
SUT VICRYL 3-0 RB1 18 ABS (SUTURE) ×4 IMPLANT
TOWEL GREEN STERILE (TOWEL DISPOSABLE) ×4 IMPLANT
TOWEL GREEN STERILE FF (TOWEL DISPOSABLE) ×4 IMPLANT
TRAY FOLEY W/METER SILVER 16FR (SET/KITS/TRAYS/PACK) ×4 IMPLANT
TUBE MAZOR SA REDUCTION (TUBING) ×4 IMPLANT
WATER STERILE IRR 1000ML POUR (IV SOLUTION) ×8 IMPLANT
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
## 2017-05-26 NOTE — Transfer of Care (Signed)
Immediate Anesthesia Transfer of Care Note  Patient: Melissa Kramer  Procedure(s) Performed: LATERAL LUMBAR THREE CORPECTOMY; Posterior instrumentation Lumbar One-Two, Lumbar Four-Five with mazor (N/A Spine Lumbar) POSTERIOR LUMBAR FUSION INSTRUMENTAL STABILIZATION LUMBAR ONE-TWO, LUMBAR FOUR-FIVE (N/A Spine Lumbar) APPLICATION OF ROBOTIC ASSISTANCE FOR SPINAL PROCEDURE (N/A )  Patient Location: PACU  Anesthesia Type:General  Level of Consciousness: awake, oriented, patient cooperative and responds to stimulation  Airway & Oxygen Therapy: Patient Spontanous Breathing and Patient connected to nasal cannula oxygen  Post-op Assessment: Report given to RN, Post -op Vital signs reviewed and stable and Patient moving all extremities X 4  Post vital signs: Reviewed and stable  Last Vitals:  Vitals Value Taken Time  BP 162/97 05/26/2017  7:34 PM  Temp    Pulse 107 05/26/2017  7:39 PM  Resp 18 05/26/2017  7:39 PM  SpO2 100 % 05/26/2017  7:39 PM  Vitals shown include unvalidated device data.  Last Pain:  Vitals:   05/26/17 0800  TempSrc: Oral  PainSc: 0-No pain      Patients Stated Pain Goal: 0 (05/24/17 1636)  Complications: No apparent anesthesia complications
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
## 2017-05-26 NOTE — Anesthesia Preprocedure Evaluation (Signed)
Anesthesia Evaluation  Patient identified by MRN, date of birth, ID band Patient awake    Reviewed: Allergy & Precautions, H&P , Patient's Chart, lab work & pertinent test results, reviewed documented beta blocker date and time   Airway Mallampati: II  TM Distance: >3 FB Neck ROM: full    Dental no notable dental hx.    Pulmonary    Pulmonary exam normal breath sounds clear to auscultation       Cardiovascular  Rhythm:regular Rate:Normal     Neuro/Psych    GI/Hepatic   Endo/Other    Renal/GU      Musculoskeletal   Abdominal   Peds  Hematology   Anesthesia Other Findings   Reproductive/Obstetrics                             Anesthesia Physical Anesthesia Plan  ASA: II  Anesthesia Plan: General   Post-op Pain Management:    Induction: Intravenous  PONV Risk Score and Plan: 2 and Dexamethasone, Ondansetron and Treatment may vary due to age or medical condition  Airway Management Planned: Oral ETT  Additional Equipment:   Intra-op Plan:   Post-operative Plan: Extubation in OR  Informed Consent: I have reviewed the patients History and Physical, chart, labs and discussed the procedure including the risks, benefits and alternatives for the proposed anesthesia with the patient or authorized representative who has indicated his/her understanding and acceptance.   Dental Advisory Given  Plan Discussed with: CRNA and Surgeon  Anesthesia Plan Comments: (  )        Anesthesia Quick Evaluation  

## 2017-05-26 NOTE — Anesthesia Postprocedure Evaluation (Signed)
Anesthesia Post Note  Patient: Melissa Kramer  Procedure(s) Performed: LATERAL LUMBAR THREE CORPECTOMY; Posterior instrumentation Lumbar One-Two, Lumbar Four-Five with mazor (N/A Spine Lumbar) POSTERIOR LUMBAR FUSION INSTRUMENTAL STABILIZATION LUMBAR ONE-TWO, LUMBAR FOUR-FIVE (N/A Spine Lumbar) APPLICATION OF ROBOTIC ASSISTANCE FOR SPINAL PROCEDURE (N/A )     Patient location during evaluation: PACU Anesthesia Type: General Level of consciousness: awake and alert Pain management: pain level controlled Vital Signs Assessment: post-procedure vital signs reviewed and stable Respiratory status: spontaneous breathing, nonlabored ventilation, respiratory function stable and patient connected to nasal cannula oxygen Cardiovascular status: blood pressure returned to baseline and stable Postop Assessment: no apparent nausea or vomiting Anesthetic complications: no    Last Vitals:  Vitals:   05/26/17 2000 05/26/17 2006  BP:  (!) 143/93  Pulse: 98 (!) 101  Resp: 19 (!) 9  Temp:    SpO2: 100% 100%    Last Pain:  Vitals:   05/26/17 2006  TempSrc:   PainSc: Asleep                 Jahlani Lorentz,W. EDMOND
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
## 2017-05-26 NOTE — Anesthesia Postprocedure Evaluation (Signed)
Anesthesia Post Note  Patient: Melissa Kramer  Procedure(s) Performed: LATERAL LUMBAR THREE CORPECTOMY; Posterior instrumentation Lumbar One-Two, Lumbar Four-Five with mazor (N/A Spine Lumbar) POSTERIOR LUMBAR FUSION INSTRUMENTAL STABILIZATION LUMBAR ONE-TWO, LUMBAR FOUR-FIVE (N/A Spine Lumbar) APPLICATION OF ROBOTIC ASSISTANCE FOR SPINAL PROCEDURE (N/A )     Anesthesia Post Evaluation  Last Vitals:  Vitals:   05/26/17 0400 05/26/17 0800  BP: (!) 128/91 128/88  Pulse: 76 89  Resp: 11 14  Temp:  36.9 C  SpO2: 97% 98%    Last Pain:  Vitals:   05/26/17 0800  TempSrc: Oral  PainSc: 0-No pain                 Keiffer Piper
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
## 2017-05-27 ENCOUNTER — Encounter (HOSPITAL_COMMUNITY): Payer: Self-pay | Admitting: Neurosurgery

## 2017-05-27 LAB — POCT I-STAT 4, (NA,K, GLUC, HGB,HCT)
Glucose, Bld: 123 mg/dL — ABNORMAL HIGH (ref 65–99)
HEMATOCRIT: 28 % — AB (ref 36.0–46.0)
HEMOGLOBIN: 9.5 g/dL — AB (ref 12.0–15.0)
Potassium: 3.9 mmol/L (ref 3.5–5.1)
SODIUM: 138 mmol/L (ref 135–145)

## 2017-05-27 LAB — BASIC METABOLIC PANEL
Anion gap: 6 (ref 5–15)
CALCIUM: 8.4 mg/dL — AB (ref 8.9–10.3)
CHLORIDE: 104 mmol/L (ref 101–111)
CO2: 26 mmol/L (ref 22–32)
CREATININE: 0.73 mg/dL (ref 0.44–1.00)
GFR calc Af Amer: >60 mL/min (ref >60)
GFR calc non Af Amer: >60 mL/min (ref >60)
GLUCOSE: 106 mg/dL — AB (ref 65–99)
Potassium: 3.7 mmol/L (ref 3.5–5.1)
Sodium: 136 mmol/L (ref 135–145)

## 2017-05-27 LAB — CBC
HEMATOCRIT: 26.5 % — AB (ref 36.0–46.0)
Hemoglobin: 9 g/dL — ABNORMAL LOW (ref 12.0–15.0)
MCH: 29 pg (ref 26.0–34.0)
MCHC: 34 g/dL (ref 30.0–36.0)
MCV: 85.5 fL (ref 78.0–100.0)
Platelets: 146 10*3/uL — ABNORMAL LOW (ref 150–400)
RBC: 3.1 MIL/uL — ABNORMAL LOW (ref 3.87–5.11)
RDW: 12.3 % (ref 11.5–15.5)
WBC: 9.4 10*3/uL (ref 4.0–10.5)

## 2017-05-27 LAB — APTT: aPTT: 31 seconds (ref 24–36)

## 2017-05-27 LAB — PROTIME-INR
INR: 1.09
Prothrombin Time: 14 seconds (ref 11.4–15.2)

## 2017-05-27 MED ORDER — TRAMADOL HCL 50 MG PO TABS
50.0000 mg | ORAL_TABLET | Freq: Four times a day (QID) | ORAL | Status: DC
  Administered 2017-05-27 – 2017-05-28 (×3): 50 mg via ORAL
  Filled 2017-05-27 (×3): qty 1

## 2017-05-27 MED ORDER — BISACODYL 5 MG PO TBEC
10.0000 mg | DELAYED_RELEASE_TABLET | Freq: Every day | ORAL | Status: DC | PRN
  Administered 2017-05-28: 10 mg via ORAL
  Filled 2017-05-27: qty 2

## 2017-05-27 MED ORDER — POLYETHYLENE GLYCOL 3350 17 G PO PACK
17.0000 g | PACK | Freq: Every day | ORAL | Status: DC
  Administered 2017-05-27 – 2017-06-01 (×4): 17 g via ORAL
  Filled 2017-05-27 (×5): qty 1

## 2017-05-27 MED ORDER — MAGNESIUM CITRATE PO SOLN
0.5000 | Freq: Once | ORAL | Status: AC
  Administered 2017-05-27: 0.5 via ORAL
  Filled 2017-05-27: qty 296

## 2017-05-27 MED FILL — Thrombin For Soln 5000 Unit: CUTANEOUS | Qty: 5000 | Status: AC

## 2017-05-27 MED FILL — Thrombin For Soln 20000 Unit: CUTANEOUS | Qty: 1 | Status: AC

## 2017-05-27 NOTE — Progress Notes (Signed)
Consult from the unit Chaplain, Chaplain Jamal Maesim Greene.  Checked in on patient today post op.  Provided active listening to the patient as she shares she experienced an eight hour surgery.  She wants prayer for pain and overall healing.  Will pray with her, provide ministry of presence, encouragement for the journey and will continue to follow up.    05/27/17 1042  Clinical Encounter Type  Visited With Patient and family together  Visit Type Follow-up;Spiritual support  Spiritual Encounters  Spiritual Needs Prayer
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
## 2017-05-27 NOTE — Progress Notes (Addendum)
OT Cancellation Note  Patient Details Name: Melissa Kramer MRN: [id number] DOB: [birth year]   Cancelled Treatment:    Reason Eval/Treat Not Completed: Active bedrest order. Will await increase in activity orders prior to initiating OT evaluation. Thank you!   Caliber Landess M Tyrone Balash Jaron Czarnecki MSOT, OTR/L Acute Rehab Pager: [phone] Office: [phone]  05/27/2017, 7:22 AM
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
## 2017-05-28 DIAGNOSIS — S32032A Unstable burst fracture of third lumbar vertebra, initial encounter for closed fracture: Principal | ICD-10-CM

## 2017-05-28 DIAGNOSIS — S2242XA Multiple fractures of ribs, left side, initial encounter for closed fracture: Secondary | ICD-10-CM

## 2017-05-28 LAB — CBC
HCT: 27 % — ABNORMAL LOW (ref 36.0–46.0)
HEMOGLOBIN: 9.1 g/dL — AB (ref 12.0–15.0)
MCH: 29.3 pg (ref 26.0–34.0)
MCHC: 33.7 g/dL (ref 30.0–36.0)
MCV: 86.8 fL (ref 78.0–100.0)
PLATELETS: 168 10*3/uL (ref 150–400)
RBC: 3.11 MIL/uL — ABNORMAL LOW (ref 3.87–5.11)
RDW: 12.5 % (ref 11.5–15.5)
WBC: 9.8 10*3/uL (ref 4.0–10.5)

## 2017-05-28 MED ORDER — GABAPENTIN 400 MG PO CAPS
400.0000 mg | ORAL_CAPSULE | Freq: Three times a day (TID) | ORAL | Status: DC
  Administered 2017-05-28 – 2017-06-02 (×14): 400 mg via ORAL
  Filled 2017-05-28 (×15): qty 1

## 2017-05-28 MED ORDER — MAGNESIUM HYDROXIDE 400 MG/5ML PO SUSP
30.0000 mL | Freq: Once | ORAL | Status: AC
  Administered 2017-05-28: 30 mL via ORAL
  Filled 2017-05-28: qty 30

## 2017-05-28 MED ORDER — OXYCODONE HCL 5 MG PO TABS
10.0000 mg | ORAL_TABLET | ORAL | Status: DC | PRN
  Administered 2017-05-29 – 2017-05-30 (×2): 15 mg via ORAL
  Administered 2017-05-30 (×2): 10 mg via ORAL
  Administered 2017-05-31 – 2017-06-02 (×6): 15 mg via ORAL
  Filled 2017-05-28 (×8): qty 3
  Filled 2017-05-28: qty 2
  Filled 2017-05-28: qty 3
  Filled 2017-05-28: qty 2

## 2017-05-28 MED ORDER — TRAMADOL HCL 50 MG PO TABS
100.0000 mg | ORAL_TABLET | Freq: Four times a day (QID) | ORAL | Status: DC
  Administered 2017-05-28 – 2017-06-02 (×14): 100 mg via ORAL
  Filled 2017-05-28 (×16): qty 2
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
## 2017-05-28 NOTE — Progress Notes (Addendum)
PT/OT recommending CIR, and pt/mom requesting inpatient rehab at [facility] in [place], [place] if possible.  Spoke with mom, Danella SensingFay, to see if pt has any insurance, and she does not.  Explained to mother that rehab facilities often do not take outside transfers without insurance, but I will certainly call [facility] to see if they will consider her.  She understands this.    I spoke with Tammy at [facility] [phone] phone, [phone], fax), and she states they do accept patients without insurance from time to time, and are open to considering her for admission.  Their office is closed tomorrow 4/19 for the Easter holiday, but Babette Relicammy states she should be able to have an answer on Monday.  Will fax referral to Tammy at [facility] in [place].  Notified mom that pt is being considered at [facility].  She is aware that transportation may be an issue, as non emergent ambulance would be out of pocket and costly.  Mother states she has an SUV, and hopeful that pt may be able to tolerate sitting position better by discharge date.    Quintella BatonJulie W. Alaze Garverick, RN, BSN  Trauma/Neuro ICU Case Manager [phone]
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
## 2017-05-28 NOTE — Progress Notes (Signed)
Neurosurgery Progress Note  No issues overnight. Complains of mild back pain, but manageable Endorses LLE weakness (stable), no new focal deficits  EXAM:  BP 124/83 (BP Location: Right Arm)   Pulse 96   Temp 98.2 F (36.8 C) (Oral)   Resp 12   Ht 5\' 9"  (1.753 m)   Wt 58.1 kg (128 lb)   LMP 05/16/2017 (Exact Date)   SpO2 97%   BMI 18.90 kg/m   Awake, alert, oriented  Speech fluent, appropriate  CN grossly intact  RUE/RLE 5/5 LUE 5/5 LLE: 2/5 proximal, otherwise 5/5 distal  IMPRESSION/PLAN 22 y.o. female POD#2 trans-psoas L3 corpectomy, L1-L5 stabilization. Stable left IP and quad weakness - hopeful this will improve with time.  PT/OT rec CIR. [place] is from [place] so CM/CSW to work on placement hopefully in [place]. Continue TLSO brace when OOB.

## 2017-05-29 MED ORDER — GI COCKTAIL ~~LOC~~
30.0000 mL | Freq: Once | ORAL | Status: AC
Start: 2017-05-29 — End: 2017-05-29
  Administered 2017-05-29: 30 mL via ORAL
  Filled 2017-05-29: qty 30

## 2017-05-29 NOTE — Progress Notes (Signed)
BP (!) 133/97   Pulse (!) 102   Temp 98.2 F (36.8 C) (Oral)   Resp 12   Ht 5\' 9"  (1.753 m)   Wt 58.1 kg (128 lb)   LMP 05/16/2017 (Exact Date)   SpO2 96%   BMI 18.90 kg/m  Alert and oriented x 4, speech is clear and fluent Moving all extremities, weak still left hip flexors Wounds are clean, and dry. No signs of infection.
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
## 2017-05-29 NOTE — Progress Notes (Signed)
[facility] inpatient rehab admissions - I met with patient's mom and family at the bedside.  Patient was napping.  Family would like inpatient rehab at [facility].  Nurse case manager is aware and has contacted [facility].  Patient does not have insurance.  I gave patient's mom rehab handbook.  I will check back on Monday for progress.  I will be following along in case rehab is needed here at [facility].  Call me for questions.  #[phone] 

## 2017-05-29 NOTE — Progress Notes (Signed)
PT complaining of chest pain. Gave pt prn pain med, relieved pain, called MD to notify. MD suggested GI cocktail. Order carried out. Will administer Per MAR. Will cont to monitor pt.  VS are stable and pt is alert X4 and oriented.
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
## 2017-05-30 NOTE — Progress Notes (Signed)
Patient ID: Melissa JackShamia Kramer, female   DOB: 1995-03-24, 22 y.o.   MRN: [id number] BP 113/81 (BP Location: Left Arm)   Pulse 85   Temp 97.7 F (36.5 C) (Oral)   Resp (!) 22   Ht 5\' 9"  (1.753 m)   Wt 58.1 kg (128 lb)   LMP 05/16/2017 (Exact Date)   SpO2 97%   BMI 18.90 kg/m  Alert and oriented x 4, speech is clear Continued weakness in left hip flexors Wounds clean, and dry Continue pt

## 2017-05-31 ENCOUNTER — Inpatient Hospital Stay (HOSPITAL_COMMUNITY): Payer: No Typology Code available for payment source

## 2017-05-31 MED ORDER — MAGNESIUM HYDROXIDE 400 MG/5ML PO SUSP
30.0000 mL | Freq: Every day | ORAL | Status: DC
  Administered 2017-05-31 – 2017-06-01 (×2): 30 mL via ORAL
  Filled 2017-05-31 (×3): qty 30
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
## 2017-05-31 NOTE — Progress Notes (Signed)
Subjective: Patient reports sore in back with abdominal bloating  Objective: Vital signs in last 24 hours: Temp:  [97.9 F (36.6 C)-99.6 F (37.6 C)] 97.9 F (36.6 C) (04/21 0800) Pulse Rate:  [81-92] 85 (04/20 1700) Resp:  [11-15] 14 (04/20 1700) BP: (120-125)/(75-89) 122/75 (04/21 0800) SpO2:  [97 %-99 %] 98 % (04/20 1700)  Intake/Output from previous day: 04/20 0701 - 04/21 0700 In: 7400 [I.V.:7400] Out: 450 [Urine:450] Intake/Output this shift: No intake/output data recorded.  Physical Exam: Stable HF weakness.    Lab Results: No results for input(s): WBC, HGB, HCT, PLT in the last 72 hours. BMET No results for input(s): NA, K, CL, CO2, GLUCOSE, BUN, CREATININE, CALCIUM in the last 72 hours.  Studies/Results: No results found.  Assessment/Plan: Doing well.  Awaiting Rehab placement.    LOS: 8 days    Dorian HeckleSTERN,Journii Nierman D, MD 05/31/2017, 10:17 AM
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
## 2017-06-01 NOTE — Progress Notes (Signed)
Per a phone call from patient mother, patient does have health care coverage (medicaid).

## 2017-06-01 NOTE — Progress Notes (Signed)
Pt was able to progress towards the later part of the day in terms of ambulation. She ambulated 3 x's today. Bed to bedside commode then chair, chair to outside of her room and back to chair, chair to bed, and then ambulated in the hallways with family.  Educated patient on IV meds versus oral meds and patient did not receive any IV medications during my shift as a way to ween her and "get her home" which she states is her goal.  One episode of vomit in the a.m. Discussed eating even though she is feeling sick (dry heaving hurts more than expelling food).

## 2017-06-01 NOTE — Plan of Care (Signed)
Over HS shift patient Received  PRNs: Dilaudid IV x2, Morphine IV x2, Oxycodone PO x2, Robaxin PO x1, Zofran IV x2, Phenergan IV x2.  Observed to have very little appetite and willingness to eat.  Ate 1 applesauce cup and 1/2 of a fruit cup.    No BM this shift despite received Senna and Colace.    LEFT anterior thigh reported to have decreased sensation with pain and numbness. Observed to move LEFT leg little independently.

## 2017-06-01 NOTE — Progress Notes (Signed)
Patient ID: Melissa Kramer, female   DOB: [birth year], 22 y.o.   MRN: [id number] BP (!) 133/92 (BP Location: Left Arm)   Pulse 88   Temp 98.3 F (36.8 C) (Oral)   Resp 10   Ht 5\' 9"  (1.753 m)   Wt 58.1 kg (128 lb)   LMP 05/16/2017 (Exact Date)   SpO2 98%   BMI 18.90 kg/m  Alert and oriented x 4 Remains weak in left hip flexors Wounds are clean, dry, and without signs of infection Expect improvement in left lower extremity strength
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
## 2017-06-02 MED ORDER — ACETAMINOPHEN 325 MG PO TABS: 650.0000 mg | ORAL_TABLET | ORAL | Status: AC | PRN

## 2017-06-02 MED ORDER — TRAMADOL HCL 50 MG PO TABS: 100.0000 mg | ORAL_TABLET | Freq: Four times a day (QID) | ORAL | 0 refills | Status: AC | PRN

## 2017-06-02 MED ORDER — METHOCARBAMOL 750 MG PO TABS: 750.0000 mg | ORAL_TABLET | Freq: Three times a day (TID) | ORAL | 0 refills | Status: AC

## 2017-06-02 MED ORDER — POLYETHYLENE GLYCOL 3350 17 G PO PACK: 17.0000 g | PACK | Freq: Every day | ORAL | 0 refills | Status: AC

## 2017-06-02 MED ORDER — OXYCODONE HCL 10 MG PO TABS
10.0000 mg | ORAL_TABLET | ORAL | 0 refills | Status: AC | PRN
Start: 2017-06-02 — End: ?

## 2017-06-02 MED ORDER — SENNA 8.6 MG PO TABS: 1.0000 | ORAL_TABLET | Freq: Two times a day (BID) | ORAL | 0 refills | Status: AC

## 2017-06-02 MED ORDER — ENOXAPARIN SODIUM 40 MG/0.4ML ~~LOC~~ SOLN: 40.0000 mg | SUBCUTANEOUS | 0 refills | Status: AC

## 2017-06-02 MED ORDER — BISACODYL 5 MG PO TBEC: 10.0000 mg | DELAYED_RELEASE_TABLET | Freq: Every day | ORAL | 0 refills | Status: AC | PRN

## 2017-06-02 MED ORDER — PANTOPRAZOLE SODIUM 40 MG PO TBEC: 40.0000 mg | DELAYED_RELEASE_TABLET | Freq: Every day | ORAL | 0 refills | Status: AC

## 2017-06-02 MED ORDER — GABAPENTIN 400 MG PO CAPS: 400.0000 mg | ORAL_CAPSULE | Freq: Three times a day (TID) | ORAL | 1 refills | Status: AC

## 2017-06-02 NOTE — Progress Notes (Signed)
Notified by Tammy at [facility] that bed is available at rehab facility today and pt has been accepted for admission.  Notified attending MD, pt and grandmother, and they are pleased with this news.  [place] her way [place] [facility] now, and will transport pt to rehab facility, per pt/family preference.  Bedside nurse to premedicate pt with pain meds prior to discharge.  [facility].    Quintella BatonJulie W. Hannah Crill, RN, BSN  Trauma/Neuro ICU Case Manager [phone]

## 2017-06-02 NOTE — Progress Notes (Signed)
Updated clinical and PT notes faxed to Tammy at [facility].  Spoke with Tammy, and she states she may have bed available later today; will call me back with updates as available.  Updated pt and grandmother.  Pt's mother on the way from [place] now.  Pt states she plans to transport to [place] via private vehicle, as seats lay back.  Will provide information regarding transfer as it is available.     Quintella BatonJulie W. Lillybeth Tal, RN, BSN  Trauma/Neuro ICU Case Manager [phone]

## 2017-06-02 NOTE — Progress Notes (Signed)
Pt very motivated to leave. Was up ambulating to the bathroom early this a.m., participated in her hygiene and then transferred to the chair. Patient sat in chair an hour and a half and then transferred back to bed.  Patient is unsteady and wobbly without her walker but tolerated well.  IV pain medication has been d/c for patient.  After lunch patient ambulated without the walker in the halls on the unit. Patient had 1 episode of vomitus. Patient has been seen by Trauma Physician and P.A and agree patient is set to transfer.

## 2017-06-02 NOTE — Progress Notes (Signed)
Patient IV removed.  Pain meds given. Zofran given. Patient discharge complete. Patient has been discharged with family to [facility] [place].
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
